# Patient Record
Sex: Female | Born: 2005 | Race: White | Hispanic: Yes | Marital: Single | Smoking: Never smoker
Health system: Southern US, Community
[De-identification: ages and names within clinical notes are randomized; demographics above are authoritative.]

---
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
## 2023-08-12 ENCOUNTER — Ambulatory Visit (HOSPITAL_COMMUNITY)
Admission: EM | Admit: 2023-08-12 | Discharge: 2023-08-12 | Disposition: A | Discharge status: Home / Self Care | Attending: Emergency Medicine | Admitting: Emergency Medicine

## 2023-08-12 ENCOUNTER — Encounter (HOSPITAL_COMMUNITY): Payer: Self-pay

## 2023-08-12 DIAGNOSIS — B9789 Other viral agents as the cause of diseases classified elsewhere: Secondary | ICD-10-CM | POA: Insufficient documentation

## 2023-08-12 DIAGNOSIS — H66012 Acute suppurative otitis media with spontaneous rupture of ear drum, left ear: Secondary | ICD-10-CM | POA: Insufficient documentation

## 2023-08-12 DIAGNOSIS — J988 Other specified respiratory disorders: Secondary | ICD-10-CM | POA: Insufficient documentation

## 2023-08-12 LAB — POC COVID19/FLU A&B COMBO
Covid Antigen, POC: NEGATIVE
Influenza A Antigen, POC: NEGATIVE
Influenza B Antigen, POC: NEGATIVE

## 2023-08-12 LAB — POCT RAPID STREP A (OFFICE): Rapid Strep A Screen: NEGATIVE

## 2023-08-12 MED ORDER — AMOXICILLIN-POT CLAVULANATE 875-125 MG PO TABS: 1.0000 | ORAL_TABLET | Freq: Two times a day (BID) | ORAL | 0 refills | Status: DC

## 2023-08-12 MED ORDER — AZELASTINE HCL 0.1 % NA SOLN: 2.0000 | Freq: Two times a day (BID) | NASAL | 0 refills | Status: DC

## 2023-08-12 NOTE — ED Triage Notes (Signed)
 Patient with c/o sore throat since Saturday. States she has had a fever on and off mostly at night. Patient also with cough and congestion.

## 2023-08-12 NOTE — Discharge Instructions (Addendum)
 Start taking Augmentin twice daily for 7 days. Use azelastine nasal spray twice daily for congestion. Otherwise alternate between 600 mg of Tylenol and 400 mg of ibuprofen as needed for sore throat. Return here as needed.

## 2023-08-12 NOTE — ED Provider Notes (Signed)
 [facility]    CSN: [id number] Arrival date & time: 08/12/23  1043      History   Chief Complaint Chief Complaint  Patient presents with   Sore Throat    HPI Misty Berg is a 18 y.o. female.   Patient presents with sore throat, mild cough, congestion, and bilateral ear pain that began on 4/12.  Patient also reports intermittent subjective fever.  Denies chest pain, shortness of breath, nausea, vomiting, diarrhea, body aches.  Patient denies taking any medication for her symptoms.  The history is provided by the patient and medical records. No language interpreter was used (speaks english).  Sore Throat    History reviewed. No pertinent past medical history.  There are no active problems to display for this patient.   History reviewed. No pertinent surgical history.  OB History   No obstetric history on file.      Home Medications    Prior to Admission medications   Medication Sig Start Date End Date Taking? Authorizing Provider  amoxicillin-clavulanate (AUGMENTIN) 875-125 MG tablet Take 1 tablet by mouth every 12 (twelve) hours. 08/12/23  Yes Susann Givens, Floraine Buechler A, NP  azelastine (ASTELIN) 0.1 % nasal spray Place 2 sprays into both nostrils 2 (two) times daily. Use in each nostril as directed 08/12/23  Yes Letta Kocher, NP    Family History History reviewed. No pertinent family history.  Social History Social History   Tobacco Use   Smoking status: Never    Passive exposure: Never  Vaping Use   Vaping status: Never Used  Substance Use Topics   Drug use: Never     Allergies   Patient has no known allergies.   Review of Systems Review of Systems  Per HPI  Physical Exam Triage Vital Signs ED Triage Vitals  Encounter Vitals Group     BP 08/12/23 1145 116/74     Systolic BP Percentile --      Diastolic BP Percentile --      Pulse Rate 08/12/23 1145 94     Resp 08/12/23 1145 18     Temp 08/12/23 1145 97.9 F (36.6  C)     Temp Source 08/12/23 1145 Oral     SpO2 08/12/23 1145 98 %     Weight --      Height --      Head Circumference --      Peak Flow --      Pain Score 08/12/23 1146 7     Pain Loc --      Pain Education --      Exclude from Growth Chart --    No data found.  Updated Vital Signs BP 116/74   Pulse 94   Temp 97.9 F (36.6 C) (Oral)   Resp 18   LMP 08/12/2023 (Exact Date)   SpO2 98%   Visual Acuity Right Eye Distance:   Left Eye Distance:   Bilateral Distance:    Right Eye Near:   Left Eye Near:    Bilateral Near:     Physical Exam Vitals and nursing note reviewed.  Constitutional:      General: She is awake. She is not in acute distress.    Appearance: Normal appearance. She is well-developed and well-groomed. She is not ill-appearing.  HENT:     Right Ear: There is impacted cerumen.     Left Ear: There is impacted cerumen.     Nose: Congestion and rhinorrhea present.     Mouth/Throat:  Mouth: Mucous membranes are moist.     Pharynx: Posterior oropharyngeal erythema present. No oropharyngeal exudate.  Cardiovascular:     Rate and Rhythm: Normal rate and regular rhythm.  Pulmonary:     Effort: Pulmonary effort is normal.     Breath sounds: Normal breath sounds.  Skin:    General: Skin is warm and dry.  Neurological:     Mental Status: She is alert.  Psychiatric:        Behavior: Behavior is cooperative.      UC Treatments / Results  Labs (all labs ordered are listed, but only abnormal results are displayed) Labs Reviewed  POC COVID19/FLU A&B COMBO - Normal  POCT RAPID STREP A (OFFICE) - Normal  CULTURE, GROUP A STREP [facility])    EKG   Radiology No results found.  Procedures Procedures (including critical care time)  Medications Ordered in UC Medications - No data to display  Initial Impression / Assessment and Plan / UC Course  I have reviewed the triage vital signs and the nursing notes.  Pertinent labs & imaging results that were  available during my care of the patient were reviewed by me and considered in my medical decision making (see chart for details).     Patient is well-appearing.  Vitals are stable.  Upon assessment mild congestion and rhinorrhea present, mild erythema noted to pharynx.  Bilateral ears with impacted cerumen.  Rapid strep, flu, and COVID all negative.  Will send culture for strep to ensure no presence of strep infection.  Upon reassessment after irrigation left TM appears to be perforated with surrounding erythema to canal.  Prescribed Augmentin for otitis media with perforation.  Prescribed azelastine as needed for congestion.  Discussed over-the-counter medication as needed for sore throat.  Discussed return precautions. Final Clinical Impressions(s) / UC Diagnoses   Final diagnoses:  Viral respiratory illness  Non-recurrent acute suppurative otitis media of left ear with spontaneous rupture of tympanic membrane     Discharge Instructions      Start taking Augmentin twice daily for 7 days. Use azelastine nasal spray twice daily for congestion. Otherwise alternate between 600 mg of Tylenol and 400 mg of ibuprofen as needed for sore throat. Return here as needed.     ED Prescriptions     Medication Sig Dispense Auth. Provider   amoxicillin-clavulanate (AUGMENTIN) 875-125 MG tablet Take 1 tablet by mouth every 12 (twelve) hours. 14 tablet Rosevelt Constable, Dametria Tuzzolino A, NP   azelastine (ASTELIN) 0.1 % nasal spray Place 2 sprays into both nostrils 2 (two) times daily. Use in each nostril as directed 30 mL Levora Reas A, NP      PDMP not reviewed this encounter.   Levora Reas A, NP 08/12/23 1300

## 2023-08-15 LAB — CULTURE, GROUP A STREP (THRC)

## 2023-11-16 ENCOUNTER — Encounter (HOSPITAL_COMMUNITY): Payer: Self-pay

## 2023-11-16 ENCOUNTER — Ambulatory Visit (HOSPITAL_COMMUNITY): Admission: EM | Admit: 2023-11-16 | Discharge: 2023-11-16 | Disposition: A | Discharge status: Home / Self Care

## 2023-11-16 DIAGNOSIS — K529 Noninfective gastroenteritis and colitis, unspecified: Secondary | ICD-10-CM | POA: Insufficient documentation

## 2023-11-16 DIAGNOSIS — R197 Diarrhea, unspecified: Secondary | ICD-10-CM | POA: Diagnosis present

## 2023-11-16 DIAGNOSIS — R112 Nausea with vomiting, unspecified: Secondary | ICD-10-CM | POA: Insufficient documentation

## 2023-11-16 DIAGNOSIS — R1013 Epigastric pain: Secondary | ICD-10-CM | POA: Diagnosis not present

## 2023-11-16 MED ORDER — ONDANSETRON 4 MG PO TBDP
ORAL_TABLET | ORAL | Status: AC
  Filled 2023-11-16: qty 1

## 2023-11-16 MED ORDER — DIPHENOXYLATE-ATROPINE 2.5-0.025 MG PO TABS: 1.0000 | ORAL_TABLET | Freq: Three times a day (TID) | ORAL | 0 refills | Status: AC | PRN

## 2023-11-16 MED ORDER — ONDANSETRON 4 MG PO TBDP
4.0000 mg | ORAL_TABLET | Freq: Once | ORAL | Status: AC
  Administered 2023-11-16: 4 mg via ORAL

## 2023-11-16 MED ORDER — ONDANSETRON 4 MG PO TBDP: 4.0000 mg | ORAL_TABLET | Freq: Three times a day (TID) | ORAL | 0 refills | Status: AC | PRN

## 2023-11-16 NOTE — ED Triage Notes (Signed)
 Patient here today with c/o upper abd pain, vomiting, and diarrhea X 1 week. Patient traveled to [place] and [place] and returned a week ago. She has tried taking Pepto Bismol, Alka Seltzer, and other OTC diarrhea medication with no relief.

## 2023-11-16 NOTE — Discharge Instructions (Addendum)
 Nausea, vomiting, diarrhea and epigastric abdominal pain are most consistent with a gastroenteritis.  This is an infection in the intestinal tract that could be related to a bacteria, a virus or a parasite.  Given the recent travel to [place] and [place], this is most likely the case.  We will do a stool sample and await results for further treatment.  Please bring the stool sample back between 8 AM and 8 PM.  If you cannot bring it within an hour then recommend putting it in a bag and refrigerating it till you can bring it down.  We will contact you with the results and arrange for further treatment.  Reassuringly, your physical exam findings and your vital signs are good. In the interim we will focus on improving your current symptoms to make sure that you are able to stay hydrated.  We will treat with the following: Zofran  4 mg orally disintegrating tablet every 8 hours as needed for nausea. First dose given at 7:40. Try Imodium AD over the counter to help with diarrhea symptoms first. If this does not help, then may try the prescription Lomotil  2.5 mg every 8 hours as needed for diarrhea/loose stools.  Rest and stay hydrated.  Make sure to drink plenty of fluids even if you do not feel like taking in solid foods Monitor your symptoms, if you develop worsening abdominal pain, fevers, inability to keep any fluids down then recommend going to the emergency room immediately for further evaluation.

## 2023-11-16 NOTE — ED Provider Notes (Signed)
 [facility]    CSN: [id number] Arrival date & time: 11/16/23  1854      History   Chief Complaint Chief Complaint  Patient presents with   Abdominal Pain    HPI Misty Berg is a 18 y.o. female.   18 year old female who presents to urgent care with complaints of nausea, vomiting and diarrhea along with some epigastric abdominal pain.  This started Monday last week.  She had just flown back from staying with family in [place] and [place] on Sunday.  She began to develop diarrhea the next day.  She has continued to have numerous episodes of diarrhea daily.  She is able to keep fluids down but reports that when she is eating solid foods she tends to vomit afterwards.  Her symptoms have gotten a little bit better in the last 2 days.  She denies any fevers, dysuria, hematuria, vomiting of blood, blood in the stool.  She reports that she when she was there she did try to drink from bottle water but she did eat out twice and reports that she was brushing her teeth with the water from the water trough in their home.     Abdominal Pain Associated symptoms: diarrhea, nausea and vomiting   Associated symptoms: no chest pain, no chills, no cough, no dysuria, no fever, no hematuria, no shortness of breath, no sore throat and no vaginal discharge     No past medical history on file.  There are no active problems to display for this patient.   No past surgical history on file.  OB History   No obstetric history on file.      Home Medications    Prior to Admission medications   Medication Sig Start Date End Date Taking? Authorizing Provider  diphenoxylate -atropine  (LOMOTIL ) 2.5-0.025 MG tablet Take 1 tablet by mouth every 8 (eight) hours as needed for diarrhea or loose stools. 11/16/23  Yes Teresa Almarie LABOR, PA-C  FEROSUL 325 (65 Fe) MG tablet Take 325 mg by mouth daily. 08/04/23  Yes [provider]  ondansetron  (ZOFRAN -ODT) 4 MG disintegrating  tablet Take 1 tablet (4 mg total) by mouth every 8 (eight) hours as needed for nausea or vomiting. 11/16/23  Yes Teresa Almarie LABOR, PA-C    Family History No family history on file.  Social History Social History   Tobacco Use   Smoking status: Never    Passive exposure: Never  Vaping Use   Vaping status: Never Used  Substance Use Topics   Alcohol use: Never   Drug use: Never     Allergies   Patient has no known allergies.   Review of Systems Review of Systems  Constitutional:  Positive for appetite change. Negative for chills and fever.  HENT:  Negative for ear pain and sore throat.   Eyes:  Negative for pain and visual disturbance.  Respiratory:  Negative for cough and shortness of breath.   Cardiovascular:  Negative for chest pain and palpitations.  Gastrointestinal:  Positive for abdominal pain, diarrhea, nausea and vomiting.  Genitourinary:  Negative for difficulty urinating, dysuria, frequency, hematuria, urgency and vaginal discharge.  Musculoskeletal:  Negative for arthralgias and back pain.  Skin:  Negative for color change and rash.  Neurological:  Negative for seizures and syncope.  All other systems reviewed and are negative.    Physical Exam Triage Vital Signs ED Triage Vitals  Encounter Vitals Group     BP 11/16/23 1911 107/73     Girls Systolic  BP Percentile --      Girls Diastolic BP Percentile --      Boys Systolic BP Percentile --      Boys Diastolic BP Percentile --      Pulse Rate 11/16/23 1911 79     Resp 11/16/23 1911 16     Temp 11/16/23 1911 98.8 F (37.1 C)     Temp Source 11/16/23 1911 Oral     SpO2 11/16/23 1911 96 %     Weight --      Height --      Head Circumference --      Peak Flow --      Pain Score 11/16/23 1908 7     Pain Loc --      Pain Education --      Exclude from Growth Chart --    No data found.  Updated Vital Signs BP 107/73 (BP Location: Right Arm)   Pulse 79   Temp 98.8 F (37.1 C) (Oral)   Resp 16    LMP 11/03/2023 (Exact Date)   SpO2 96%   Visual Acuity Right Eye Distance:   Left Eye Distance:   Bilateral Distance:    Right Eye Near:   Left Eye Near:    Bilateral Near:     Physical Exam Vitals and nursing note reviewed.  Constitutional:      General: She is not in acute distress.    Appearance: She is well-developed.  HENT:     Head: Normocephalic and atraumatic.     Mouth/Throat:     Mouth: Mucous membranes are moist.  Eyes:     Extraocular Movements: Extraocular movements intact.     Conjunctiva/sclera: Conjunctivae normal.     Pupils: Pupils are equal, round, and reactive to light.  Cardiovascular:     Rate and Rhythm: Normal rate and regular rhythm.     Heart sounds: Normal heart sounds. No murmur heard. Pulmonary:     Effort: Pulmonary effort is normal. No respiratory distress.     Breath sounds: Normal breath sounds.  Abdominal:     General: Bowel sounds are normal. There is no distension.     Palpations: Abdomen is soft.     Tenderness: There is abdominal tenderness in the epigastric area. There is no right CVA tenderness, left CVA tenderness, guarding or rebound. Negative signs include Murphy's sign and McBurney's sign.  Musculoskeletal:        General: No swelling.     Cervical back: Neck supple.  Skin:    General: Skin is warm and dry.     Capillary Refill: Capillary refill takes less than 2 seconds.  Neurological:     Mental Status: She is alert.  Psychiatric:        Mood and Affect: Mood normal.      UC Treatments / Results  Labs (all labs ordered are listed, but only abnormal results are displayed) Labs Reviewed  GASTROINTESTINAL PANEL BY PCR, STOOL (REPLACES STOOL CULTURE)    EKG   Radiology No results found.  Procedures Procedures (including critical care time)  Medications Ordered in UC Medications  ondansetron  (ZOFRAN -ODT) disintegrating tablet 4 mg (4 mg Oral Given 11/16/23 1932)    Initial Impression / Assessment and Plan /  UC Course  I have reviewed the triage vital signs and the nursing notes.  Pertinent labs & imaging results that were available during my care of the patient were reviewed by me and considered in my medical decision making (see  chart for details).     Gastroenteritis  Nausea vomiting and diarrhea  Abdominal pain, epigastric   Nausea, vomiting, diarrhea and epigastric abdominal pain are most consistent with a gastroenteritis.  This is an infection in the intestinal tract that could be related to a bacteria, a virus or a parasite.  Given the recent travel to [place] and [place], this is most likely the case.  We will do a stool sample today and await results for further treatment. Reassuringly, your physical exam findings and your vital signs are good. In the interim we will focus on improving your current symptoms to make sure that you are able to stay hydrated.  We will treat with the following: Zofran  4 mg orally disintegrating tablet every 8 hours as needed for nausea. First dose given at 7:40. Try Imodium AD over the counter to help with diarrhea symptoms first. If this does not help, then may try the prescription Lomotil  2.5 mg every 8 hours as needed for diarrhea/loose stools.  Rest and stay hydrated.  Make sure to drink plenty of fluids even if you do not feel like taking in solid foods Monitor your symptoms, if you develop worsening abdominal pain, fevers, inability to keep any fluids down then recommend going to the emergency room immediately for further evaluation.  Final Clinical Impressions(s) / UC Diagnoses   Final diagnoses:  Gastroenteritis  Nausea vomiting and diarrhea  Abdominal pain, epigastric     Discharge Instructions      Nausea, vomiting, diarrhea and epigastric abdominal pain are most consistent with a gastroenteritis.  This is an infection in the intestinal tract that could be related to a bacteria, a virus or a parasite.  [place] this is most likely the case.  We will do a stool sample today and await results for further treatment.  We will contact you with the results and arrange for further treatment.  Reassuringly, your physical exam findings and your vital signs are good. In the interim we will focus on improving your current symptoms to make sure that you are able to stay hydrated.  We will treat with the following: Zofran  4 mg orally disintegrating tablet every 8 hours as needed for nausea. First dose given at 7:40. Try Imodium AD over the counter to help with diarrhea symptoms first. If this does not help, then may try the prescription Lomotil  2.5 mg every 8 hours as needed for diarrhea/loose stools.  Rest and stay hydrated.  Make sure to drink plenty of fluids even if you do not feel like taking in solid foods Monitor your symptoms, if you develop worsening abdominal pain, fevers, inability to keep any fluids down then recommend going to the emergency room immediately for further evaluation.       ED Prescriptions     Medication Sig Dispense Auth. Provider   ondansetron  (ZOFRAN -ODT) 4 MG disintegrating tablet Take 1 tablet (4 mg total) by mouth every 8 (eight) hours as needed for nausea or vomiting. 20 tablet Denny Lave A, PA-C   diphenoxylate -atropine  (LOMOTIL ) 2.5-0.025 MG tablet Take 1 tablet by mouth every 8 (eight) hours as needed for diarrhea or loose stools. 30 tablet Teresa Almarie LABOR, [place]      I have reviewed the PDMP during this encounter.   Teresa Almarie LABOR, [place] 11/16/23 1952

## 2023-11-18 ENCOUNTER — Ambulatory Visit (HOSPITAL_COMMUNITY): Payer: Self-pay

## 2023-11-18 ENCOUNTER — Encounter (HOSPITAL_COMMUNITY): Payer: Self-pay

## 2023-11-18 LAB — GASTROINTESTINAL PANEL BY PCR, STOOL (REPLACES STOOL CULTURE)
Adenovirus F40/41: NOT DETECTED
Astrovirus: NOT DETECTED
Campylobacter species: DETECTED — AB
Cryptosporidium: DETECTED — AB
Cyclospora cayetanensis: NOT DETECTED
Entamoeba histolytica: NOT DETECTED
Enteroaggregative E coli (EAEC): NOT DETECTED
Enteropathogenic E coli (EPEC): NOT DETECTED
Enterotoxigenic E coli (ETEC): NOT DETECTED
Giardia lamblia: NOT DETECTED
Norovirus GI/GII: NOT DETECTED
Plesimonas shigelloides: NOT DETECTED
Rotavirus A: NOT DETECTED
Salmonella species: NOT DETECTED
Sapovirus (I, II, IV, and V): NOT DETECTED
Shiga like toxin producing E coli (STEC): NOT DETECTED
Shigella/Enteroinvasive E coli (EIEC): NOT DETECTED
Vibrio cholerae: NOT DETECTED
Vibrio species: NOT DETECTED
Yersinia enterocolitica: NOT DETECTED

## 2023-11-18 NOTE — Telephone Encounter (Signed)
 Patient did not exhibit any signs of severe disease during encounter.patient reported feeling better over the past 2 days prior to being seen.  Campylobacter and Cryptosporidium infection is therefore self-limiting and does not require antibiotic treatment at this time.  Continue conservative care as recommended by provider.
